# Patient Record
Sex: Male | Born: 1980 | Race: White | Hispanic: No | Marital: Married | Smoking: Never smoker
Health system: Southern US, Community
[De-identification: ages and names within clinical notes are randomized; demographics above are authoritative.]

## PROBLEM LIST (undated history)

## (undated) DIAGNOSIS — J309 Allergic rhinitis, unspecified: Secondary | ICD-10-CM

## (undated) DIAGNOSIS — K219 Gastro-esophageal reflux disease without esophagitis: Secondary | ICD-10-CM

## (undated) HISTORY — DX: Allergic rhinitis, unspecified: J30.9

## (undated) HISTORY — DX: Gastro-esophageal reflux disease without esophagitis: K21.9

---

## 2016-11-23 ENCOUNTER — Encounter: Payer: Self-pay | Admitting: Internal Medicine

## 2016-11-23 ENCOUNTER — Ambulatory Visit (INDEPENDENT_AMBULATORY_CARE_PROVIDER_SITE_OTHER): Payer: Self-pay | Admitting: Internal Medicine

## 2016-11-23 DIAGNOSIS — F5101 Primary insomnia: Secondary | ICD-10-CM

## 2016-11-23 DIAGNOSIS — E669 Obesity, unspecified: Secondary | ICD-10-CM | POA: Insufficient documentation

## 2016-11-23 DIAGNOSIS — G4700 Insomnia, unspecified: Secondary | ICD-10-CM | POA: Insufficient documentation

## 2016-11-23 DIAGNOSIS — J309 Allergic rhinitis, unspecified: Secondary | ICD-10-CM | POA: Insufficient documentation

## 2016-11-23 MED ORDER — ZOLPIDEM TARTRATE 10 MG PO TABS: 10.0000 mg | ORAL_TABLET | Freq: Every evening | ORAL | 1 refills | Status: DC | PRN

## 2016-11-23 NOTE — Progress Notes (Signed)
   Subjective:    Patient ID: Eric Gonzales, male    DOB: 10-30-1980, 36 y.o.   MRN: [id number]  HPI  Here as new pt -  Works as Astronomer;  Often flies to [employer] for work but can only work at night as would be too disruptive to normal operations during the day.  Flies at [place] twice per month, usually less than 1 wk each time, hard to get to sleep in the AM when working, and hard to then get back to regular sleep hours on return home. Tylenol PM use makes him drowsy. Did try moms ambien and slept 8 hrs.  Mother is Eric Gonzales and Eric Gonzales, both patients here as well.  No recent wt gain for several yrs, does not snore that he know of, tends to be a light sleeper.  No reports of sleep difficulty otherwise or daytime somnolence.  Wt Readings from Last 3 Encounters:  11/23/16 240 lb (108.9 kg)    Past Medical History:  Diagnosis Date  . Allergic rhinitis    History reviewed. No pertinent surgical history.  reports that he has never smoked. He has never used smokeless tobacco. He reports that he does not drink alcohol or use drugs. family history includes Alcohol abuse in his maternal grandfather; Diabetes in his father and mother; Heart disease in his father and mother; Hyperlipidemia in his father and mother. No Known Allergies No current outpatient prescriptions on file prior to visit.   No current facility-administered medications on file prior to visit.    Review of Systems All otherwise neg per pt except does have some ongoing anxiety and frend died in car accident wearing seatbelt    Objective:   Physical Exam BP 120/84   Pulse 77   Ht  (1.778 m)   Wt 240 lb (108.9 kg)   SpO2 98%   BMI 34.44 kg/m   VS noted, obese Constitutional: Pt appears in NAD HENT: Head: NCAT.  Right Ear: External ear normal.  Left Ear: External ear normal.  Eyes: . Pupils are equal, round, and reactive to light. Conjunctivae and EOM are normal Nose: without d/c or deformity Neck:  Neck supple. Gross normal ROM Cardiovascular: Normal rate and regular rhythm.   Pulmonary/Chest: Effort normal and breath sounds without rales or wheezing.  Abd:  Soft, NT, ND, + BS, no organomegaly Neurological: Pt is alert. At baseline orientation, motor grossly intact Skin: Skin is warm. No rashes, other new lesions, no LE edema Psychiatric: Pt behavior is normal without agitation , mild nervous No other exam findings     Assessment & Plan:

## 2016-11-23 NOTE — Progress Notes (Signed)
Pre visit review using our clinic review tool, if applicable. No additional management support is needed unless otherwise documented below in the visit note. 

## 2016-11-23 NOTE — Assessment & Plan Note (Signed)
With situational sleep difficutly not excepted to improve due to work requirement, for [employer] 10 qhs prn,  to f/u any worsening symptoms or concerns

## 2016-11-23 NOTE — Patient Instructions (Addendum)
Please take all new medication as prescribed - the ambien  Please continue all other medications as before, and refills have been done if requested.  Please have the pharmacy call with any other refills you may need.  Please continue your efforts at being more active, low cholesterol diet, and weight control.  Please keep your appointments with your specialists as you may have planned  Please return in 1 year for your yearly visit, or sooner if needed, or as needed

## 2018-01-03 ENCOUNTER — Encounter

## 2018-01-03 ENCOUNTER — Ambulatory Visit (INDEPENDENT_AMBULATORY_CARE_PROVIDER_SITE_OTHER): Payer: Self-pay | Admitting: Internal Medicine

## 2018-01-03 ENCOUNTER — Encounter: Payer: Self-pay | Admitting: Internal Medicine

## 2018-01-03 VITALS — BP 120/86 | HR 81 | Ht 70.0 in | Wt 247.0 lb

## 2018-01-03 DIAGNOSIS — E66811 Obesity, class 1: Secondary | ICD-10-CM

## 2018-01-03 DIAGNOSIS — K219 Gastro-esophageal reflux disease without esophagitis: Secondary | ICD-10-CM

## 2018-01-03 DIAGNOSIS — F5101 Primary insomnia: Secondary | ICD-10-CM

## 2018-01-03 DIAGNOSIS — E669 Obesity, unspecified: Secondary | ICD-10-CM

## 2018-01-03 HISTORY — DX: Gastro-esophageal reflux disease without esophagitis: K21.9

## 2018-01-03 MED ORDER — PANTOPRAZOLE SODIUM 40 MG PO TBEC: 40.0000 mg | DELAYED_RELEASE_TABLET | Freq: Every day | ORAL | 3 refills | Status: DC

## 2018-01-03 MED ORDER — ZOLPIDEM TARTRATE 10 MG PO TABS: 10.0000 mg | ORAL_TABLET | Freq: Every evening | ORAL | 1 refills | Status: DC | PRN

## 2018-01-03 NOTE — Patient Instructions (Signed)
Please take all new medication as prescribed - the protonix  Please continue all other medications as before, and refills have been done if requested - ambien  Please have the pharmacy call with any other refills you may need.  Please continue your efforts at being more active, low cholesterol diet, and weight control.  Please keep your appointments with your specialists as you may have planned

## 2018-01-03 NOTE — Progress Notes (Signed)
   Subjective:    Patient ID: Eric Gonzales, male    DOB: 1981-03-07, 37 y.o.   MRN: [id number]  HPI  Here to f/u; overall doing ok,  Pt denies chest pain, increasing sob or doe, wheezing, orthopnea, PND, increased LE swelling, palpitations, dizziness or syncope.  Pt denies new neurological symptoms such as new headache, or facial or extremity weakness or numbness.  Pt denies polydipsia, polyuria, or low sugar episode.  Pt states overall good compliance with meds, mostly trying to follow appropriate diet, with wt overall stable,  but little exercise however. Has ongoing insomnia recently in pat few months, asks for ambien refill.  Denies worsening depressive symptoms, suicidal ideation, or panic.  Has gained several more pounds, denies daytime hypersomnolence . Has had mild worsening reflux, but no abd pain, dysphagia, n/v, bowel change or blood. Wt Readings from Last 3 Encounters:  01/03/18 247 lb (112 kg)  11/23/16 240 lb (108.9 kg)   Past Medical History:  Diagnosis Date  . Allergic rhinitis   . GERD (gastroesophageal reflux disease) 01/03/2018   History reviewed. No pertinent surgical history.  reports that he has never smoked. He has never used smokeless tobacco. He reports that he does not drink alcohol or use drugs. family history includes Alcohol abuse in his maternal grandfather; Diabetes in his father and mother; Heart disease in his father and mother; Hyperlipidemia in his father and mother. No Known Allergies  Review of Systems All otherwise neg per pt     Objective:   Physical Exam BP 120/86 (BP Location: Left Arm, Patient Position: Sitting, Cuff Size: Large)   Pulse 81   Ht 5\' 10"  (1.778 m)   Wt 247 lb (112 kg)   SpO2 98%   BMI 35.44 kg/m  VS noted, obese Constitutional: Pt appears in NAD HENT: Head: NCAT.  Right Ear: External ear normal.  Left Ear: External ear normal.  Eyes: . Pupils are equal, round, and reactive to light. Conjunctivae and EOM are normal Nose:  without d/c or deformity Neck: Neck supple. Gross normal ROM Cardiovascular: Normal rate and regular rhythm.   Pulmonary/Chest: Effort normal and breath sounds without rales or wheezing.  Abd:  Soft, NT, ND, + BS, no organomegaly Neurological: Pt is alert. At baseline orientation, motor grossly intact Skin: Skin is warm. No rashes, other new lesions, no LE edema Psychiatric: Pt behavior is normal without agitation , 1+ nervous No prior labs, declines lab today    Assessment & Plan:

## 2018-01-05 NOTE — Assessment & Plan Note (Signed)
D/w pt, for increased activity, lower calories, wt control

## 2018-01-05 NOTE — Assessment & Plan Note (Signed)
Mild to mod, for PPI asd,  to f/u any worsening symptoms or concerns 

## 2018-01-05 NOTE — Assessment & Plan Note (Signed)
Chronic persistent, for ambien refill

## 2018-01-08 ENCOUNTER — Telehealth: Payer: Self-pay | Admitting: Internal Medicine

## 2018-01-08 MED ORDER — ZOLPIDEM TARTRATE 10 MG PO TABS: 10.0000 mg | ORAL_TABLET | Freq: Every evening | ORAL | 1 refills | Status: DC | PRN

## 2018-01-08 MED ORDER — PANTOPRAZOLE SODIUM 40 MG PO TBEC: 40.0000 mg | DELAYED_RELEASE_TABLET | Freq: Every day | ORAL | 3 refills | Status: DC

## 2018-01-08 NOTE — Telephone Encounter (Signed)
Copied from CRM [phone]. Topic: Quick Communication - See Telephone Encounter >> Jan 08, 2018  1:57 PM Tamela OddiMartin, Don'Quashia, NT wrote: CRM for notification. See Telephone encounter for: 01/08/18.  Patient called and states his medication pantoprazole (PROTONIX) 40 MG tablet and zolpidem (AMBIEN) 10 MG tablet was [facility] to [facility]. [facility] [address] - [place], [place] - [address] [phone] (Phone) [phone] (Fax)

## 2018-01-08 NOTE — Telephone Encounter (Signed)
Done erx 

## 2018-10-20 ENCOUNTER — Other Ambulatory Visit: Payer: Self-pay | Admitting: Internal Medicine

## 2018-10-20 NOTE — Telephone Encounter (Signed)
Done erx  Needs OV for further refills after this

## 2019-02-23 ENCOUNTER — Other Ambulatory Visit: Payer: Self-pay | Admitting: Internal Medicine

## 2019-03-04 ENCOUNTER — Other Ambulatory Visit: Payer: Self-pay | Admitting: Internal Medicine

## 2019-03-04 NOTE — Telephone Encounter (Signed)
Done 30 days only  Please let pt know he is due for yearly OV f/u

## 2019-04-03 ENCOUNTER — Ambulatory Visit (INDEPENDENT_AMBULATORY_CARE_PROVIDER_SITE_OTHER): Payer: Self-pay | Admitting: Internal Medicine

## 2019-04-03 DIAGNOSIS — J309 Allergic rhinitis, unspecified: Secondary | ICD-10-CM

## 2019-04-03 DIAGNOSIS — F5101 Primary insomnia: Secondary | ICD-10-CM

## 2019-04-03 DIAGNOSIS — K219 Gastro-esophageal reflux disease without esophagitis: Secondary | ICD-10-CM

## 2019-04-03 MED ORDER — ZOLPIDEM TARTRATE 10 MG PO TABS: 10.0000 mg | ORAL_TABLET | Freq: Every evening | ORAL | 1 refills | Status: DC | PRN

## 2019-04-03 MED ORDER — PANTOPRAZOLE SODIUM 40 MG PO TBEC: 40.0000 mg | DELAYED_RELEASE_TABLET | Freq: Every day | ORAL | 3 refills | Status: DC

## 2019-04-03 NOTE — Progress Notes (Signed)
Patient ID: Eric Gonzales, male   DOB: 03/24/1981, 38 y.o.   MRN: [id number]  Virtual Visit via Video Note  I connected with Eric Gonzales on 04/03/19 at  2:40 PM EDT by a video enabled telemedicine application and verified that I am speaking with the correct person using two identifiers.  Location: Patient: at home Provider: at office   I discussed the limitations of evaluation and management by telemedicine and the availability of in person appointments. The patient expressed understanding and agreed to proceed.  History of Present Illness: Here to f/u; overall doing ok,  Pt denies chest pain, increasing sob or doe, wheezing, orthopnea, PND, increased LE swelling, palpitations, dizziness or syncope.  Pt denies new neurological symptoms such as new headache, or facial or extremity weakness or numbness.  Pt denies polydipsia, polyuria, or low sugar episode.  Pt states overall good compliance with meds, though has now run out.  Denies worsening reflux, abd pain, dysphagia, n/v, bowel change or blood, except for reflux worsening in the past week out of me.  Asks for sleep med as has worsening sleep onset most nights but can usually stay asleep after.  Does have several wks ongoing nasal allergy symptoms with clearish congestion, itch and sneezing, without fever, pain, ST, cough, swelling or wheezing. Past Medical History:  Diagnosis Date  . Allergic rhinitis   . GERD (gastroesophageal reflux disease) 01/03/2018   No past surgical history on file.  reports that he has never smoked. He has never used smokeless tobacco. He reports that he does not drink alcohol or use drugs. family history includes Alcohol abuse in his maternal grandfather; Diabetes in his father and mother; Heart disease in his father and mother; Hyperlipidemia in his father and mother. No Known Allergies No current outpatient medications on file prior to visit.   No current facility-administered medications on file prior to  visit.     Observations/Objective: Alert, NAD, appropriate mood and affect, resps normal, cn 2-12 intact, moves all 4s, no visible rash or swelling No results found for: WBC, HGB, HCT, PLT, GLUCOSE, CHOL, TRIG, HDL, LDLDIRECT, LDLCALC, ALT, AST, NA, K, CL, CREATININE, BUN, CO2, TSH, PSA, INR, GLUF, HGBA1C, MICROALBUR Declines furhter labs, does not have health insurance at this time  Assessment and Plan: See notes  Follow Up Instructions: See notes   I discussed the assessment and treatment plan with the patient. The patient was provided an opportunity to ask questions and all were answered. The patient agreed with the plan and demonstrated an understanding of the instructions.   The patient was advised to call back or seek an in-person evaluation if the symptoms worsen or if the condition fails to improve as anticipated.  Cathlean Cower, MD

## 2019-04-04 ENCOUNTER — Encounter: Payer: Self-pay | Admitting: Internal Medicine

## 2019-04-04 NOTE — Patient Instructions (Signed)
Please continue all other medications as before, and refills have been done if requested.  Please have the pharmacy call with any other refills you may need.  Please continue your efforts at being more active, low cholesterol diet, and weight control.  Please keep your appointments with your specialists as you may have planned     

## 2019-04-04 NOTE — Assessment & Plan Note (Signed)
For ambien qhs prn,  to f/u any worsening symptoms or concerns  

## 2019-04-04 NOTE — Assessment & Plan Note (Signed)
For PPI,  to f/u any worsening symptoms or concerns  

## 2019-04-04 NOTE — Assessment & Plan Note (Signed)
For otc nasacort and claritin prn

## 2019-11-11 ENCOUNTER — Other Ambulatory Visit: Payer: Self-pay | Admitting: Internal Medicine

## 2019-11-11 NOTE — Telephone Encounter (Signed)
Done erx 

## 2020-08-01 ENCOUNTER — Ambulatory Visit: Payer: Self-pay | Admitting: Internal Medicine

## 2020-08-01 DIAGNOSIS — Z0289 Encounter for other administrative examinations: Secondary | ICD-10-CM

## 2020-08-04 ENCOUNTER — Encounter: Payer: Self-pay | Admitting: Internal Medicine

## 2020-08-04 ENCOUNTER — Other Ambulatory Visit: Payer: Self-pay

## 2020-08-04 ENCOUNTER — Telehealth (INDEPENDENT_AMBULATORY_CARE_PROVIDER_SITE_OTHER): Payer: Self-pay | Admitting: Internal Medicine

## 2020-08-04 DIAGNOSIS — J309 Allergic rhinitis, unspecified: Secondary | ICD-10-CM

## 2020-08-04 DIAGNOSIS — F5101 Primary insomnia: Secondary | ICD-10-CM

## 2020-08-04 DIAGNOSIS — K219 Gastro-esophageal reflux disease without esophagitis: Secondary | ICD-10-CM

## 2020-08-04 DIAGNOSIS — M25512 Pain in left shoulder: Secondary | ICD-10-CM | POA: Insufficient documentation

## 2020-08-04 MED ORDER — PANTOPRAZOLE SODIUM 40 MG PO TBEC: 40.0000 mg | DELAYED_RELEASE_TABLET | Freq: Every day | ORAL | 3 refills | Status: DC

## 2020-08-04 MED ORDER — ZOLPIDEM TARTRATE 10 MG PO TABS: 10.0000 mg | ORAL_TABLET | Freq: Every evening | ORAL | 1 refills | Status: DC | PRN

## 2020-08-04 NOTE — Assessment & Plan Note (Signed)
See notes

## 2020-08-04 NOTE — Patient Instructions (Signed)
Please take all new medication as prescribed  Please see sports medicine for the left shoulder

## 2020-08-04 NOTE — Progress Notes (Signed)
Patient ID: UZZIAH RIGG, male   DOB: 09-05-1980, 40 y.o.   MRN: [id number]  Cumulative time during 7-day interval 24 min, there was not an associated office visit for this concern within a 7 day period.  Verbal consent for services obtained from patient prior to services given.  Names of all persons present for services: Oliver Barre, MD, patient  Chief complaint: f/u reflux, insomnia, allergies with left eustachian dysfxn, and left shoulder pain  History, background, results pertinent:  40 yo M here with c/o persistent primary insomnia most nights, usually well controlled wth 1/2 - 1 of the 10 mg ambien without significant side effects and seems to be effective, asks for refill.  Also has ongoing reflux despite anti-reflux diet worse even in 1 day if he missed his PPI, asks for refill.  Also c/o left ear fullness and eustachian type symptoms when lying on left side at night and wkaing up in the AM, overall mild, but wondering if can be avoided as it is worse this time of year for last 2-3 yrs.   Also mentions left shoulder pain only in a certain position associated with abduction raising the left hand, but severe, reproducible ongoing for > 3 mo after his hand slipped up the trunk of a tree he was pushing very hard.   Past Medical History:  Diagnosis Date  . Allergic rhinitis   . GERD (gastroesophageal reflux disease) 01/03/2018   No results found for this or any previous visit (from the past 48 hour(s)).  No current outpatient medications on file prior to visit.   No current facility-administered medications on file prior to visit.   A/P/next steps:   Insomnia - cont ambien qhs prn  GERD - for continued PPI  Allergies with left eustachian dysfxn - for allegra, nasacort, mucinex prn  Left shoulder pain - suspicous for partial left rot cuff tearing - urged to f/u with sport medicine   Oliver Barre MD

## 2021-01-26 ENCOUNTER — Telehealth: Payer: Self-pay | Admitting: Internal Medicine

## 2021-01-26 NOTE — Telephone Encounter (Signed)
   Patient called and said that he tested positive for covid 19 today and has a headache and back pain. He is wanting to know what he can take OTC. Patient declined virtual/ phone visit\. Please advise

## 2021-01-27 ENCOUNTER — Other Ambulatory Visit: Payer: Self-pay | Admitting: Internal Medicine

## 2021-01-27 MED ORDER — NIRMATRELVIR/RITONAVIR (PAXLOVID)TABLET: 3.0000 | ORAL_TABLET | Freq: Two times a day (BID) | ORAL | 0 refills | Status: AC

## 2021-01-27 NOTE — Telephone Encounter (Signed)
See orders already addressed

## 2021-02-01 ENCOUNTER — Other Ambulatory Visit: Payer: Self-pay | Admitting: Internal Medicine

## 2021-02-01 MED ORDER — GUAIFENESIN ER 600 MG PO TB12: 1200.0000 mg | ORAL_TABLET | Freq: Two times a day (BID) | ORAL | 1 refills | Status: DC | PRN

## 2021-02-08 ENCOUNTER — Emergency Department (HOSPITAL_BASED_OUTPATIENT_CLINIC_OR_DEPARTMENT_OTHER): Payer: Self-pay

## 2021-02-08 ENCOUNTER — Emergency Department (HOSPITAL_BASED_OUTPATIENT_CLINIC_OR_DEPARTMENT_OTHER)
Admission: EM | Admit: 2021-02-08 | Discharge: 2021-02-08 | Disposition: A | Discharge status: Home / Self Care | Payer: Self-pay | Attending: Emergency Medicine | Admitting: Emergency Medicine

## 2021-02-08 ENCOUNTER — Encounter (HOSPITAL_BASED_OUTPATIENT_CLINIC_OR_DEPARTMENT_OTHER): Payer: Self-pay | Admitting: Emergency Medicine

## 2021-02-08 ENCOUNTER — Other Ambulatory Visit: Payer: Self-pay

## 2021-02-08 DIAGNOSIS — N201 Calculus of ureter: Secondary | ICD-10-CM

## 2021-02-08 DIAGNOSIS — N132 Hydronephrosis with renal and ureteral calculous obstruction: Secondary | ICD-10-CM | POA: Insufficient documentation

## 2021-02-08 DIAGNOSIS — K219 Gastro-esophageal reflux disease without esophagitis: Secondary | ICD-10-CM | POA: Insufficient documentation

## 2021-02-08 LAB — URINALYSIS, ROUTINE W REFLEX MICROSCOPIC
Bilirubin Urine: NEGATIVE
Glucose, UA: NEGATIVE mg/dL
Ketones, ur: NEGATIVE mg/dL
Leukocytes,Ua: NEGATIVE
Nitrite: NEGATIVE
Protein, ur: NEGATIVE mg/dL
Specific Gravity, Urine: >1.03 — ABNORMAL HIGH (ref 1.005–1.030)
pH: 5 (ref 5.0–8.0)

## 2021-02-08 LAB — URINALYSIS, MICROSCOPIC (REFLEX)

## 2021-02-08 MED ORDER — ONDANSETRON 8 MG PO TBDP: 8.0000 mg | ORAL_TABLET | Freq: Three times a day (TID) | ORAL | 0 refills | Status: DC | PRN

## 2021-02-08 MED ORDER — FENTANYL CITRATE (PF) 100 MCG/2ML IJ SOLN
50.0000 ug | INTRAMUSCULAR | Status: AC | PRN
  Administered 2021-02-08 (×2): 50 ug via INTRAVENOUS
  Filled 2021-02-08 (×2): qty 2

## 2021-02-08 MED ORDER — HYDROMORPHONE HCL 2 MG PO TABS: 2.0000 mg | ORAL_TABLET | ORAL | 0 refills | Status: DC | PRN

## 2021-02-08 MED ORDER — ONDANSETRON HCL 4 MG/2ML IJ SOLN
4.0000 mg | Freq: Once | INTRAMUSCULAR | Status: AC
  Administered 2021-02-08: 4 mg via INTRAVENOUS
  Filled 2021-02-08: qty 2

## 2021-02-08 MED ORDER — NAPROXEN 500 MG PO TABS: ORAL_TABLET | ORAL | 0 refills | Status: DC

## 2021-02-08 MED ORDER — SODIUM CHLORIDE 0.9 % IV BOLUS
1000.0000 mL | Freq: Once | INTRAVENOUS | Status: AC
  Administered 2021-02-08: 1000 mL via INTRAVENOUS

## 2021-02-08 MED ORDER — KETOROLAC TROMETHAMINE 15 MG/ML IJ SOLN
15.0000 mg | Freq: Once | INTRAMUSCULAR | Status: AC
  Administered 2021-02-08: 15 mg via INTRAVENOUS
  Filled 2021-02-08: qty 1

## 2021-02-08 NOTE — ED Notes (Signed)
Pt has been transported to CT.

## 2021-02-08 NOTE — ED Triage Notes (Signed)
Pt c/o right flank pain x 2 hours.  

## 2021-02-08 NOTE — ED Provider Notes (Signed)
[facility] Provider Note: Eric Dell, MD, FACEP  CSN: [id number] MRN: [id number] ARRIVAL: 02/08/21 at 0156 ROOM: [facility]   CHIEF COMPLAINT  Flank Pain   HISTORY OF PRESENT ILLNESS  02/08/21 2:23 AM Eric Gonzales is a 40 y.o. male with right flank pain that began about 2 hours prior to arrival.  He rates the pain as a 10 out of 10.  It is not a pain he has ever had before.  It is not better or worse with movement.  It has not been associated with nausea or vomiting.  He has not noticed hematuria but has had difficulty voiding.   Past Medical History:  Diagnosis Date   Allergic rhinitis    GERD (gastroesophageal reflux disease) 01/03/2018    History reviewed. No pertinent surgical history.  Family History  Problem Relation Age of Onset   Heart disease Mother    Hyperlipidemia Mother    Diabetes Mother    Heart disease Father    Hyperlipidemia Father    Diabetes Father    Alcohol abuse Maternal Grandfather     Social History   Tobacco Use   Smoking status: Never   Smokeless tobacco: Never  Substance Use Topics   Alcohol use: No   Drug use: No    Prior to Admission medications   Medication Sig Start Date End Date Taking? Authorizing Provider  HYDROmorphone (DILAUDID) 2 MG tablet Take 1 tablet (2 mg total) by mouth every 4 (four) hours as needed for severe pain. 02/08/21  Yes Luane Rochon, MD  naproxen (NAPROSYN) 500 MG tablet Take 1 tablet twice daily until stone passes. 02/08/21  Yes Karson Reede, MD  ondansetron (ZOFRAN ODT) 8 MG disintegrating tablet Take 1 tablet (8 mg total) by mouth every 8 (eight) hours as needed. 02/08/21  Yes Jeralynn Vaquera, MD  guaiFENesin (MUCINEX) 600 MG 12 hr tablet Take 2 tablets (1,200 mg total) by mouth 2 (two) times daily as needed. 02/01/21   Corwin Levins, MD  pantoprazole (PROTONIX) 40 MG tablet Take 1 tablet (40 mg total) by mouth daily. 08/04/20   Corwin Levins, MD  zolpidem (AMBIEN) 10 MG tablet Take 1 tablet (10 mg  total) by mouth at bedtime as needed. for sleep 08/04/20   Corwin Levins, MD    Allergies Patient has no known allergies.   REVIEW OF SYSTEMS  Negative except as noted here or in the History of Present Illness.   PHYSICAL EXAMINATION  Initial Vital Signs Blood pressure (!) 157/96, pulse 80, temperature 99.2 F (37.3 C), temperature source Oral, resp. rate 20, height 5\' 10"  (1.778 m), weight 108.9 kg, SpO2 100 %.  Examination General: Well-developed, well-nourished male in no acute distress; appearance consistent with age of record HENT: normocephalic; atraumatic Eyes: pupils equal, round and reactive to light; extraocular muscles intact Neck: supple Heart: regular rate and rhythm Lungs: clear to auscultation bilaterally Abdomen: soft; nondistended; nontender; bowel sounds present GU: No CVA tenderness Extremities: No deformity; full range of motion; pulses normal Neurologic: Awake, alert and oriented; motor function intact in all extremities and symmetric; no facial droop Skin: Warm and dry Psychiatric: Normal mood and affect   RESULTS  Summary of this visit's results, reviewed and interpreted by myself:   EKG Interpretation  Date/Time:    Ventricular Rate:    PR Interval:    QRS Duration:   QT Interval:    QTC Calculation:   R Axis:     Text Interpretation:  Laboratory Studies: Results for orders placed or performed during the hospital encounter of 02/08/21 (from the past 24 hour(s))  Urinalysis, Routine w reflex microscopic Urine, Clean Catch     Status: Abnormal   Collection Time: 02/08/21  2:14 AM  Result Value Ref Range   Color, Urine YELLOW YELLOW   APPearance CLEAR CLEAR   Specific Gravity, Urine >1.030 (H) 1.005 - 1.030   pH 5.0 5.0 - 8.0   Glucose, UA NEGATIVE NEGATIVE mg/dL   Hgb urine dipstick MODERATE (A) NEGATIVE   Bilirubin Urine NEGATIVE NEGATIVE   Ketones, ur NEGATIVE NEGATIVE mg/dL   Protein, ur NEGATIVE NEGATIVE mg/dL   Nitrite  NEGATIVE NEGATIVE   Leukocytes,Ua NEGATIVE NEGATIVE  Urinalysis, Microscopic (reflex)     Status: Abnormal   Collection Time: 02/08/21  2:14 AM  Result Value Ref Range   RBC / HPF 6-10 0 - 5 RBC/hpf   WBC, UA 0-5 0 - 5 WBC/hpf   Bacteria, UA RARE (A) NONE SEEN   Squamous Epithelial / LPF 0-5 0 - 5   Mucus PRESENT    Imaging Studies: CT Renal Stone Study  Result Date: 02/08/2021 CLINICAL DATA:  Right flank pain EXAM: CT ABDOMEN AND PELVIS WITHOUT CONTRAST TECHNIQUE: Multidetector CT imaging of the abdomen and pelvis was performed following the standard protocol without IV contrast. COMPARISON:  None. FINDINGS: LOWER CHEST: Normal. HEPATOBILIARY: Normal hepatic contours. No intra- or extrahepatic biliary dilatation. The gallbladder is normal. PANCREAS: Normal pancreas. No ductal dilatation or peripancreatic fluid collection. SPLEEN: Normal. ADRENALS/URINARY TRACT: The adrenal glands are normal. There is a stone within the distal ureter measuring 3-4 mm, causing mild hydroureteronephrosis and mild periureteral stranding. The urinary bladder is normal for degree of distention STOMACH/BOWEL: There is no hiatal hernia. Normal duodenal course and caliber. No small bowel dilatation or inflammation. No focal colonic abnormality. Normal appendix. VASCULAR/LYMPHATIC: Normal course and caliber of the major abdominal vessels. No abdominal or pelvic lymphadenopathy. REPRODUCTIVE: Normal prostate size with symmetric seminal vesicles. MUSCULOSKELETAL. No bony spinal canal stenosis or focal osseous abnormality. OTHER: None. IMPRESSION: Right-sided obstructive uropathy with 3-4 mm stone in the distal right ureter causing mild hydroureteronephrosis and mild periureteral stranding. Electronically Signed   By: Deatra Robinson M.D.   On: 02/08/2021 03:16    ED COURSE and MDM  Nursing notes, initial and subsequent vitals signs, including pulse oximetry, reviewed and interpreted by myself.  Vitals:   02/08/21 0210  02/08/21 0348  BP: (!) 157/96 (!) 162/93  Pulse: 80 87  Resp: 20   Temp: 99.2 F (37.3 C)   TempSrc: Oral   SpO2: 100% 97%  Weight: 108.9 kg   Height: 5\' 10"  (1.778 m)    Medications  fentaNYL (SUBLIMAZE) injection 50 mcg (50 mcg Intravenous Given 02/08/21 0328)  ondansetron (ZOFRAN) injection 4 mg (4 mg Intravenous Given 02/08/21 0226)  sodium chloride 0.9 % bolus 1,000 mL (0 mLs Intravenous Stopped 02/08/21 0348)  ketorolac (TORADOL) 15 MG/ML injection 15 mg (15 mg Intravenous Given 02/08/21 0330)   4:55 AM Pain well controlled at this time.  No evidence of UTI.   PROCEDURES  Procedures   ED DIAGNOSES     ICD-10-CM   1. Ureterolithiasis  N20.1          Tayden Nichelson, MD 02/08/21 [phone]

## 2021-02-12 ENCOUNTER — Other Ambulatory Visit: Payer: Self-pay | Admitting: Internal Medicine

## 2021-02-13 ENCOUNTER — Other Ambulatory Visit: Payer: Self-pay

## 2021-02-13 DIAGNOSIS — K219 Gastro-esophageal reflux disease without esophagitis: Secondary | ICD-10-CM

## 2021-02-13 MED ORDER — PANTOPRAZOLE SODIUM 40 MG PO TBEC: 40.0000 mg | DELAYED_RELEASE_TABLET | Freq: Every day | ORAL | 3 refills | Status: DC

## 2021-09-14 ENCOUNTER — Telehealth: Payer: Self-pay | Admitting: Internal Medicine

## 2021-09-14 MED ORDER — ZOLPIDEM TARTRATE 10 MG PO TABS: 10.0000 mg | ORAL_TABLET | Freq: Every evening | ORAL | 0 refills | Status: DC | PRN

## 2021-09-14 NOTE — Telephone Encounter (Signed)
1.Medication Requested: zolpidem (AMBIEN) 10 MG tablet  2. Pharmacy (Name, Street, [place]): [facility] [address] [place], [place] - [address] AT [facility] [address]  3. On Med List: yes  4. Last Visit with PCP: 08-04-2021  5. Next visit date with PCP: n/a   Agent: Please be advised that RX refills may take up to 3 business days. We ask that you follow-up with your pharmacy.

## 2021-09-14 NOTE — Telephone Encounter (Signed)
Ok to contact pt  1 mo ambien done erx to [facility]  Please to make ROV for further refills

## 2021-09-15 NOTE — Telephone Encounter (Signed)
Called patient to inform patient that his Eric Gonzales rx was sent to pharmacy and that he needs a ROV to get further refills. Patient voicemail is not set up. Unable to leave message

## 2021-10-06 ENCOUNTER — Other Ambulatory Visit: Payer: Self-pay

## 2021-10-06 ENCOUNTER — Ambulatory Visit (INDEPENDENT_AMBULATORY_CARE_PROVIDER_SITE_OTHER): Payer: Self-pay | Admitting: Internal Medicine

## 2021-10-06 ENCOUNTER — Encounter: Payer: Self-pay | Admitting: Internal Medicine

## 2021-10-06 VITALS — BP 116/68 | HR 78 | Temp 98.4°F | Ht 70.0 in | Wt 250.0 lb

## 2021-10-06 DIAGNOSIS — N200 Calculus of kidney: Secondary | ICD-10-CM

## 2021-10-06 DIAGNOSIS — F5101 Primary insomnia: Secondary | ICD-10-CM

## 2021-10-06 DIAGNOSIS — K219 Gastro-esophageal reflux disease without esophagitis: Secondary | ICD-10-CM

## 2021-10-06 MED ORDER — PANTOPRAZOLE SODIUM 40 MG PO TBEC: 40.0000 mg | DELAYED_RELEASE_TABLET | Freq: Every day | ORAL | 3 refills | Status: DC

## 2021-10-06 MED ORDER — ZOLPIDEM TARTRATE 10 MG PO TABS: 10.0000 mg | ORAL_TABLET | Freq: Every evening | ORAL | 1 refills | Status: DC | PRN

## 2021-10-06 NOTE — Progress Notes (Signed)
Patient ID: WEAVER TWEED, male   DOB: Oct 23, 1980, 41 y.o.   MRN: [id number] ? ? ? ?    Chief Complaint: follow up recent renal stone, gerd, insomnia ? ?     HPI:  Eric Gonzales is a 41 y.o. male here to f/u overall doing well, Denies urinary symptoms such as dysuria, frequency, urgency, flank pain, hematuria or n/v, fever, chills.  Denies worsening reflux, abd pain, dysphagia, n/v, bowel change or blood.  Denies worsening depressive symptoms, suicidal ideation, or panic; but has chronic persistent insomnia, asking for ambien refill as this has worked well.   ?      ?Wt Readings from Last 3 Encounters:  ?10/06/21 250 lb (113.4 kg)  ?02/08/21 240 lb (108.9 kg)  ?01/03/18 247 lb (112 kg)  ? ?BP Readings from Last 3 Encounters:  ?10/06/21 116/68  ?02/08/21 (!) 145/79  ?01/03/18 120/86  ? ?      ?Past Medical History:  ?Diagnosis Date  ? Allergic rhinitis   ? GERD (gastroesophageal reflux disease) 01/03/2018  ? ?History reviewed. No pertinent surgical history. ? reports that he has never smoked. He has never used smokeless tobacco. He reports that he does not drink alcohol and does not use drugs. ?family history includes Alcohol abuse in his maternal grandfather; Diabetes in his father and mother; Heart disease in his father and mother; Hyperlipidemia in his father and mother. ?No Known Allergies ?No current outpatient medications on file prior to visit.  ? ?No current facility-administered medications on file prior to visit.  ? ?     ROS:  All others reviewed and negative. ? ?Objective  ? ?     PE:  BP 116/68 (BP Location: Left Arm, Patient Position: Sitting, Cuff Size: Large)   Pulse 78   Temp 98.4 ?F (36.9 ?C) (Oral)   Ht 5\' 10"  (1.778 m)   Wt 250 lb (113.4 kg)   SpO2 96%   BMI 35.87 kg/m?  ? ?              Constitutional: Pt appears in NAD ?              HENT: Head: NCAT.  ?              Right Ear: External ear normal.   ?              Left Ear: External ear normal.  ?              Eyes: . Pupils are equal,  round, and reactive to light. Conjunctivae and EOM are normal ?              Nose: without d/c or deformity ?              Neck: Neck supple. Gross normal ROM ?              Cardiovascular: Normal rate and regular rhythm.   ?              Pulmonary/Chest: Effort normal and breath sounds without rales or wheezing.  ?              Abd:  Soft, NT, ND, + BS, no organomegaly ?              Neurological: Pt is alert. At baseline orientation, motor grossly intact ?              Skin: Skin is warm. No rashes,  no other new lesions, LE edema - none ?              Psychiatric: Pt behavior is normal without agitation  ? ?Micro: none ? ?Cardiac tracings I have personally interpreted today:  none ? ?Pertinent Radiological findings (summarize): none  ? ?No results found for: WBC, HGB, HCT, PLT, GLUCOSE, CHOL, TRIG, HDL, LDLDIRECT, LDLCALC, ALT, AST, NA, K, CL, CREATININE, BUN, CO2, TSH, PSA, INR, GLUF, HGBA1C, MICROALBUR ?Assessment/Plan:  ?Eric Gonzales is a 41 y.o. White or Caucasian [1] male with  has a past medical history of Allergic rhinitis and GERD (gastroesophageal reflux disease) (01/03/2018). ? ?Renal stone ?Asympt, cont to follow, keep hydrated ? ?Insomnia ?Chronic persistent, for ambien refill,  to f/u any worsening symptoms or concerns ? ?GERD (gastroesophageal reflux disease) ?Stable overall, for PPI refill, to f/u any worsening symptoms or concerns ? ?Followup: Return in about 1 year (around 10/07/2022). ? ?Oliver Barre, MD 10/07/2021 4:47 PM ?[facility] ?[facility] ?Internal Medicine ?

## 2021-10-06 NOTE — Patient Instructions (Signed)
Please continue all other medications as before, and refills have been done if requested.  Please have the pharmacy call with any other refills you may need.  Please continue your efforts at being more active, low cholesterol diet, and weight control  Please keep your appointments with your specialists as you may have planned  Please make an Appointment to return for your 1 year visit, or sooner if needed 

## 2021-10-06 NOTE — Progress Notes (Signed)
Patient ID: Eric Gonzales, male   DOB: 02-May-1981, 41 y.o.   MRN: [id number] ? ? ?

## 2021-10-07 ENCOUNTER — Encounter: Payer: Self-pay | Admitting: Internal Medicine

## 2021-10-07 NOTE — Assessment & Plan Note (Signed)
Chronic persistent, for ambien refill,  to f/u any worsening symptoms or concerns  

## 2021-10-07 NOTE — Assessment & Plan Note (Signed)
Stable overall, for PPI refill,  to f/u any worsening symptoms or concerns 

## 2021-10-07 NOTE — Assessment & Plan Note (Signed)
Asympt, cont to follow, keep hydrated ?

## 2022-04-06 ENCOUNTER — Other Ambulatory Visit: Payer: Self-pay | Admitting: Internal Medicine

## 2022-05-30 IMAGING — CT CT RENAL STONE PROTOCOL
2 of 4 series · 17 of 46 positions shown, 19 images · non-contrast
Comparison: None.

CLINICAL DATA: Right flank pain

EXAM:
CT ABDOMEN AND PELVIS WITHOUT CONTRAST
TECHNIQUE: Multidetector CT imaging of the abdomen and pelvis was performed
following the standard protocol without IV contrast.

[Series 2: axial st · axial · 0.98mm/px · z∈[+768,+1218]mm · 14 of 98 slices shown, 16 images]
[im 4/98  soft-tissue]
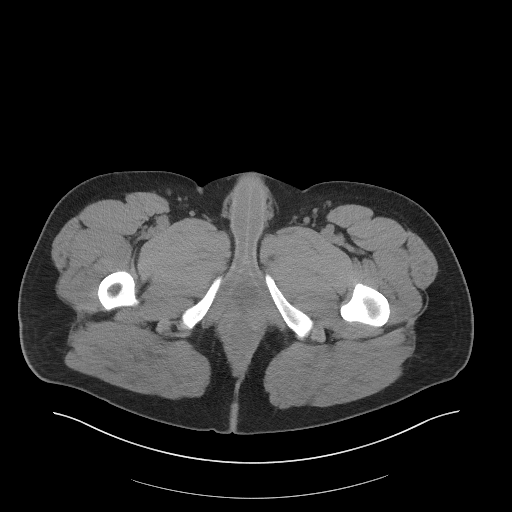
[im 4/98  bone]
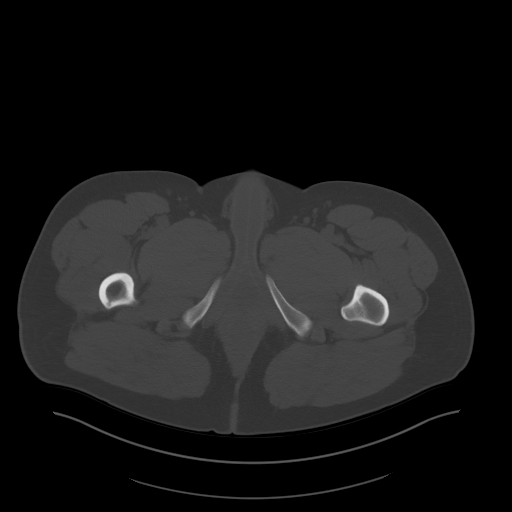
[im 12/98  soft-tissue]
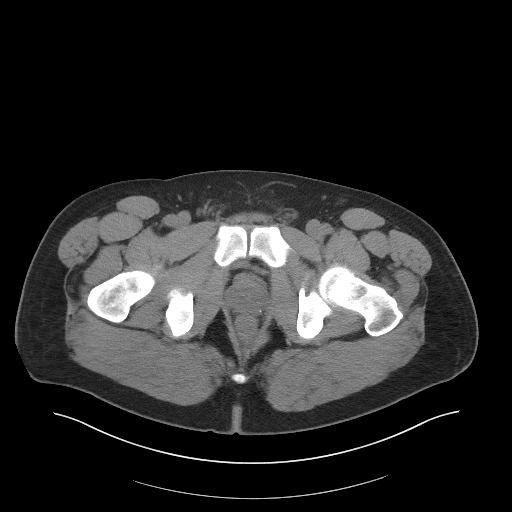
[im 20/98  soft-tissue]
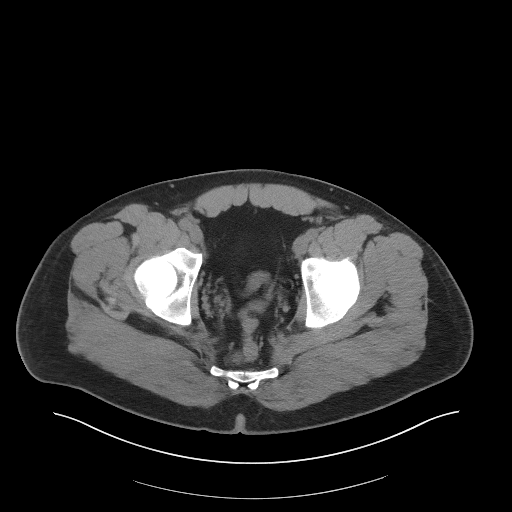
[im 28/98  soft-tissue]
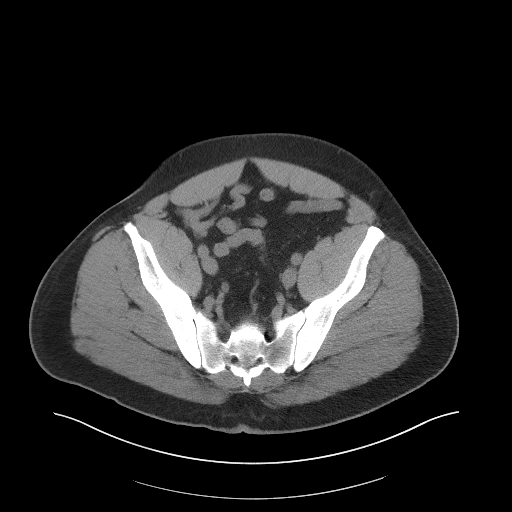
[im 32/98  soft-tissue]
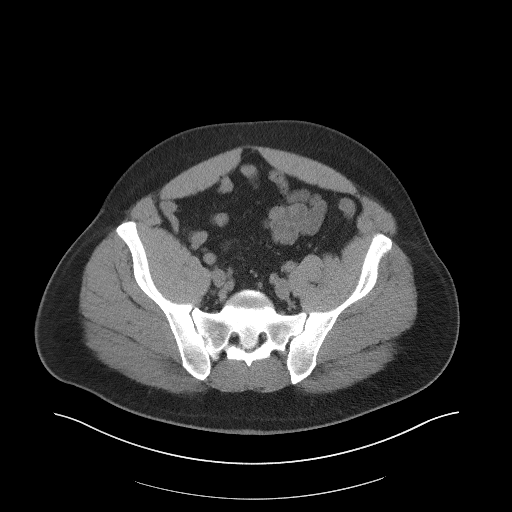
[im 39/98  soft-tissue]
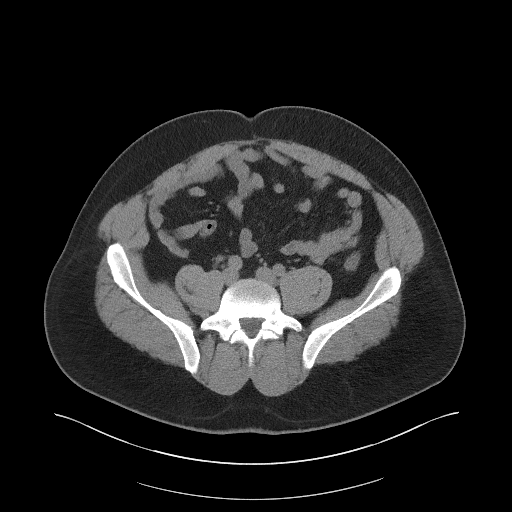
[im 47/98  soft-tissue]
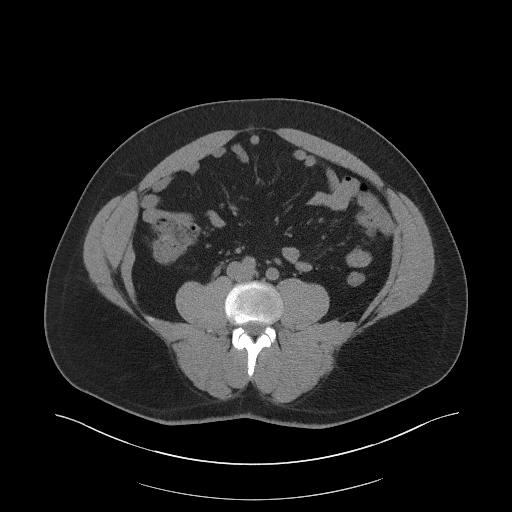
[im 51/98  soft-tissue]
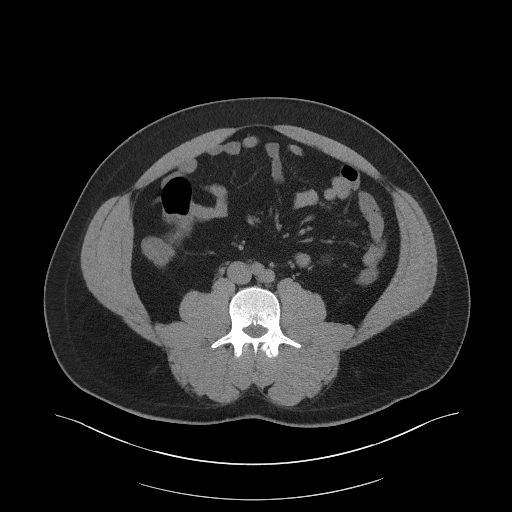
[im 59/98  soft-tissue]
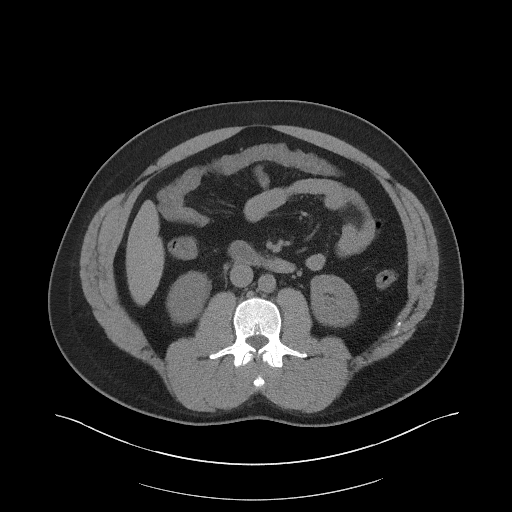
[im 59/98  bone]
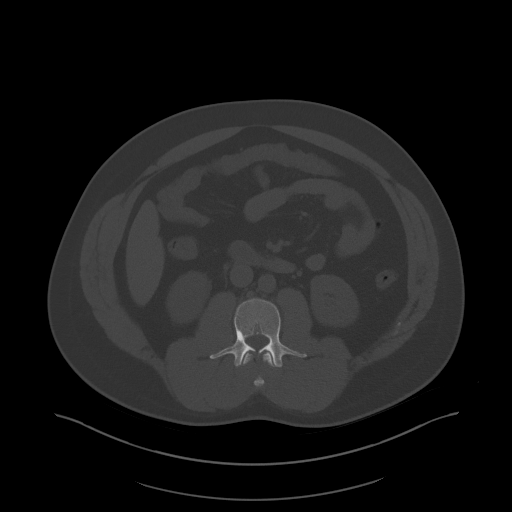
[im 66/98  soft-tissue]
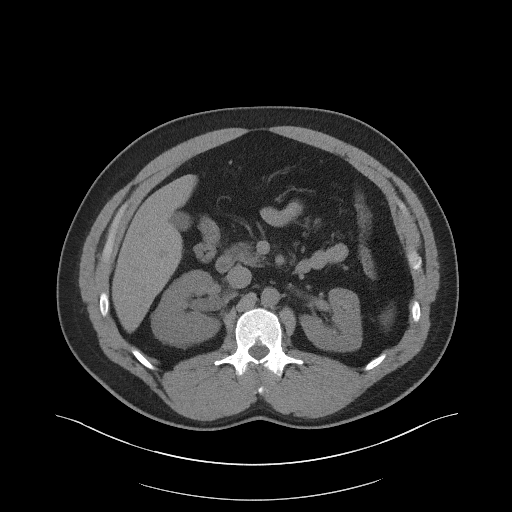
[im 74/98  soft-tissue]
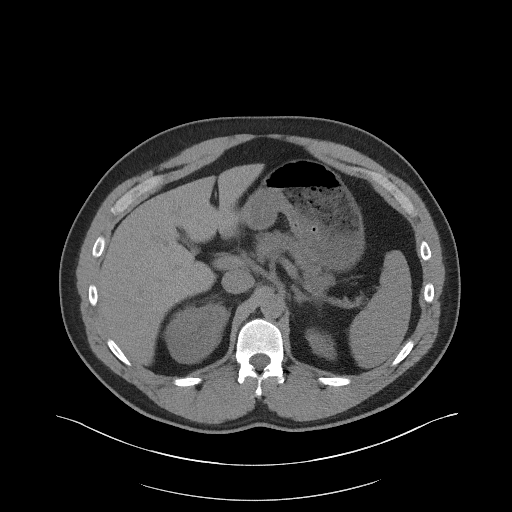
[im 78/98  soft-tissue]
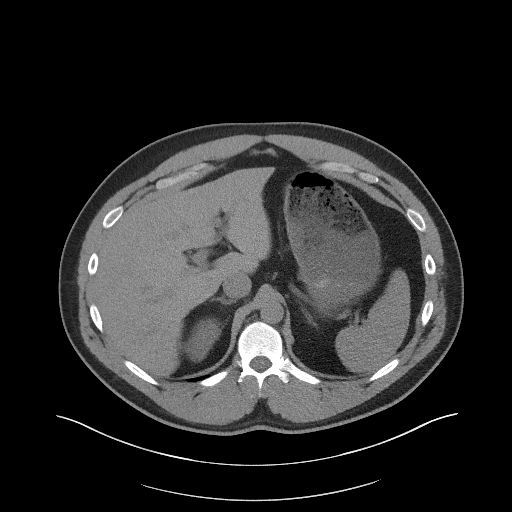
[im 86/98  soft-tissue]
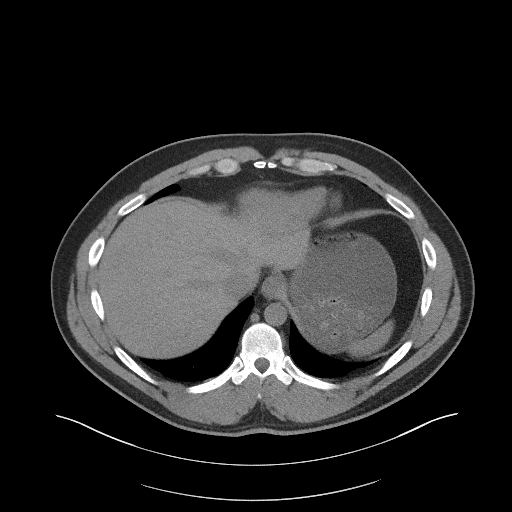
[im 94/98  soft-tissue]
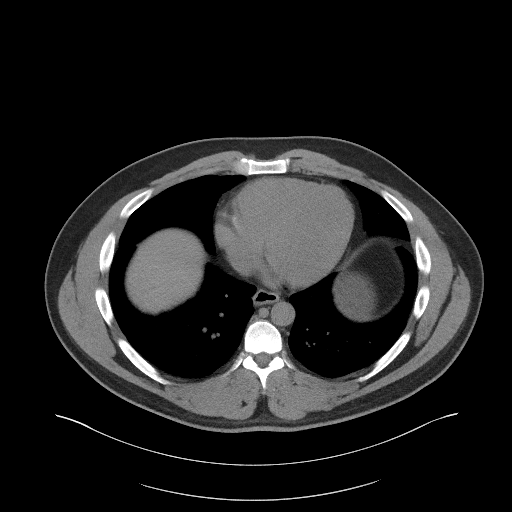

[Series 5: coronal st · coronal · 0.89mm/px · 3 of 101 slices shown]
[im 34/101  soft-tissue]
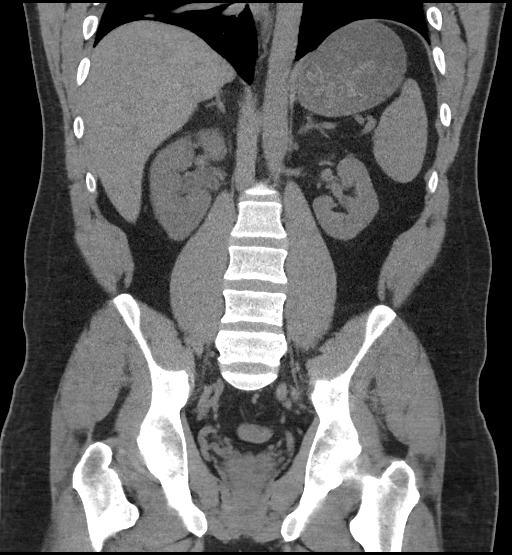
[im 45/101  soft-tissue]
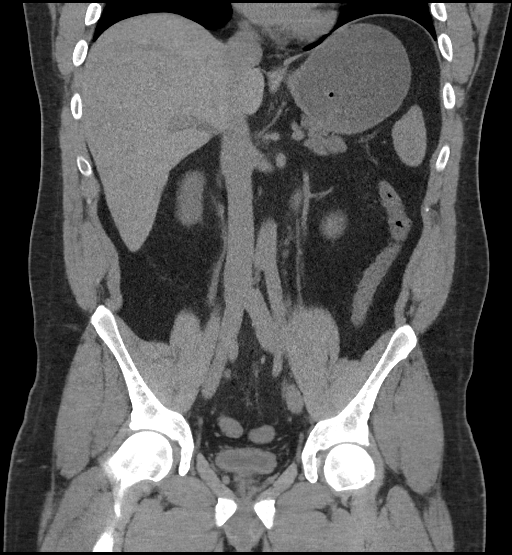
[im 56/101  soft-tissue]
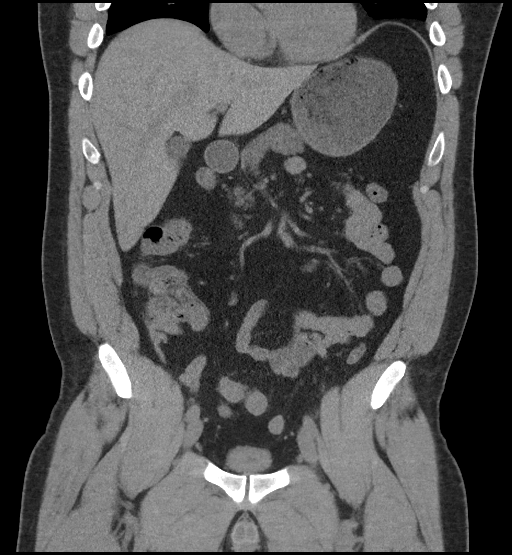

[17 of 46 positions shown; findings below may reference images not displayed]

FINDINGS: LOWER CHEST: Normal.

HEPATOBILIARY: Normal hepatic contours. No intra- or extrahepatic
biliary dilatation. The gallbladder is normal.

PANCREAS: Normal pancreas. No ductal dilatation or peripancreatic
fluid collection.

SPLEEN: Normal.

ADRENALS/URINARY TRACT: The adrenal glands are normal. There is a
stone within the distal ureter measuring 3-4 mm, causing mild
hydroureteronephrosis and mild periureteral stranding. The urinary
bladder is normal for degree of distention

STOMACH/BOWEL: There is no hiatal hernia. Normal duodenal course and
caliber. No small bowel dilatation or inflammation. No focal colonic
abnormality. Normal appendix.

VASCULAR/LYMPHATIC: Normal course and caliber of the major abdominal
vessels. No abdominal or pelvic lymphadenopathy.

REPRODUCTIVE: Normal prostate size with symmetric seminal vesicles.

MUSCULOSKELETAL. No bony spinal canal stenosis or focal osseous
abnormality.

OTHER: None.
IMPRESSION: Right-sided obstructive uropathy with 3-4 mm stone in the distal
right ureter causing mild hydroureteronephrosis and mild
periureteral stranding.

## 2022-07-09 ENCOUNTER — Other Ambulatory Visit: Payer: Self-pay | Admitting: Internal Medicine

## 2022-07-09 DIAGNOSIS — K219 Gastro-esophageal reflux disease without esophagitis: Secondary | ICD-10-CM

## 2022-07-09 NOTE — Telephone Encounter (Signed)
Please refill as per office routine med refill policy (all routine meds to be refilled for 3 mo or monthly (per pt preference) up to one year from last visit, then month to month grace period for 3 mo, then further med refills will have to be denied) ? ?

## 2022-08-28 ENCOUNTER — Encounter: Payer: Self-pay | Admitting: Internal Medicine

## 2022-08-28 ENCOUNTER — Ambulatory Visit (INDEPENDENT_AMBULATORY_CARE_PROVIDER_SITE_OTHER): Payer: Self-pay | Admitting: Internal Medicine

## 2022-08-28 VITALS — BP 138/82 | HR 89 | Temp 98.3°F | Ht 70.0 in | Wt 248.0 lb

## 2022-08-28 DIAGNOSIS — G43909 Migraine, unspecified, not intractable, without status migrainosus: Secondary | ICD-10-CM | POA: Insufficient documentation

## 2022-08-28 DIAGNOSIS — F5101 Primary insomnia: Secondary | ICD-10-CM

## 2022-08-28 DIAGNOSIS — F419 Anxiety disorder, unspecified: Secondary | ICD-10-CM

## 2022-08-28 DIAGNOSIS — G43809 Other migraine, not intractable, without status migrainosus: Secondary | ICD-10-CM

## 2022-08-28 DIAGNOSIS — K219 Gastro-esophageal reflux disease without esophagitis: Secondary | ICD-10-CM

## 2022-08-28 MED ORDER — PANTOPRAZOLE SODIUM 40 MG PO TBEC: 40.0000 mg | DELAYED_RELEASE_TABLET | Freq: Every day | ORAL | 3 refills | Status: DC

## 2022-08-28 MED ORDER — CITALOPRAM HYDROBROMIDE 20 MG PO TABS: 20.0000 mg | ORAL_TABLET | Freq: Every day | ORAL | 3 refills | Status: DC

## 2022-08-28 MED ORDER — ZOLPIDEM TARTRATE 10 MG PO TABS: ORAL_TABLET | ORAL | 1 refills | Status: DC

## 2022-08-28 MED ORDER — ALPRAZOLAM 0.5 MG PO TABS: 0.5000 mg | ORAL_TABLET | Freq: Two times a day (BID) | ORAL | 1 refills | Status: DC | PRN

## 2022-08-28 NOTE — Assessment & Plan Note (Signed)
Chronic persistent but responds to [place]  - for refil

## 2022-08-28 NOTE — Assessment & Plan Note (Signed)
Much worsening recently, likely element of PTSD as well - declines counseling referral, ok for celexa 20 mg qd, and xanax 0.5 mg bid prn

## 2022-08-28 NOTE — Assessment & Plan Note (Signed)
Ok to continue advil prn

## 2022-08-28 NOTE — Patient Instructions (Signed)
Please take all new medication as prescribed  - the celexa 20 mg per day, and xanax as needed only  Please continue all other medications as before, and refills have been done if requested - the protonix and ambien  Please have the pharmacy call with any other refills you may need.  Please continue your efforts at being more active, low cholesterol diet, and weight control.  Please keep your appointments with your specialists as you may have planned  Please make an Appointment to return in 6 months, or sooner if needed
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
## 2022-08-28 NOTE — Assessment & Plan Note (Signed)
Uncontrolled, ok to restart the protonix 40 mg qd

## 2022-08-31 ENCOUNTER — Encounter: Payer: Self-pay | Admitting: Internal Medicine

## 2022-08-31 DIAGNOSIS — E559 Vitamin D deficiency, unspecified: Secondary | ICD-10-CM

## 2022-08-31 DIAGNOSIS — Z125 Encounter for screening for malignant neoplasm of prostate: Secondary | ICD-10-CM

## 2022-08-31 DIAGNOSIS — E538 Deficiency of other specified B group vitamins: Secondary | ICD-10-CM

## 2022-08-31 DIAGNOSIS — E7800 Pure hypercholesterolemia, unspecified: Secondary | ICD-10-CM

## 2022-08-31 DIAGNOSIS — R739 Hyperglycemia, unspecified: Secondary | ICD-10-CM

## 2022-09-03 NOTE — Telephone Encounter (Signed)
LOV 08/28/22, pt was seen for anxiety. He is requesting to have labs drawn at the [facility] location, please advise

## 2022-09-11 ENCOUNTER — Encounter: Payer: Self-pay | Admitting: Internal Medicine

## 2022-09-11 ENCOUNTER — Telehealth (INDEPENDENT_AMBULATORY_CARE_PROVIDER_SITE_OTHER): Payer: Self-pay | Admitting: Internal Medicine

## 2022-09-11 DIAGNOSIS — K219 Gastro-esophageal reflux disease without esophagitis: Secondary | ICD-10-CM

## 2022-09-11 DIAGNOSIS — F419 Anxiety disorder, unspecified: Secondary | ICD-10-CM

## 2022-09-11 DIAGNOSIS — G43809 Other migraine, not intractable, without status migrainosus: Secondary | ICD-10-CM

## 2022-09-11 DIAGNOSIS — F5101 Primary insomnia: Secondary | ICD-10-CM

## 2022-09-11 MED ORDER — ALPRAZOLAM 0.25 MG PO TABS: 0.2500 mg | ORAL_TABLET | Freq: Two times a day (BID) | ORAL | 1 refills | Status: DC | PRN

## 2022-09-11 NOTE — Patient Instructions (Signed)
[place] for note today  [place] for celexa at 10 mg daily, also xanax .25 mg as discussed  Please continue all other medications as before, and refills have been done if requested.  Please have the pharmacy call with any other refills you may need.  Please keep your appointments with your specialists as you may have planned

## 2022-09-11 NOTE — Assessment & Plan Note (Signed)
[place] for note for tinted windows to help prevention migraine

## 2022-09-11 NOTE — Progress Notes (Signed)
Patient ID: Eric Gonzales, male   DOB: [birth year], 42 y.o.   MRN: [id number]  Virtual Visit via Video Note  I connected with Elouise Munroe on 09/11/22 at 10:20 AM EST by a video enabled telemedicine application and verified that I am speaking with the correct person using two identifiers.  Location of all participants today Patient: at home Provider: at office   I discussed the limitations of evaluation and management by telemedicine and the availability of in person appointments. The patient expressed understanding and agreed to proceed.  History of Present Illness: Overall states much improved anxiety but getting along with half dosing of celexa and xanax,  Denies worsening depressive symptoms, suicidal ideation, or panic; has ongoing anxiety. Needs note to alllow tinted windows in the car as bright daytime light brings on migraines.  Has ongoing insomina but ambnein working well.  Denies worsening reflux, abd pain, dysphagia, n/v, bowel change or blood.  Pt denies chest pain, increased sob or doe, wheezing, orthopnea, PND, increased LE swelling, palpitations, dizziness or syncope.   Pt denies polydipsia, polyuria, or new focal neuro s/s.     Past Medical History:  Diagnosis Date   Allergic rhinitis    GERD (gastroesophageal reflux disease) 01/03/2018   History reviewed. No pertinent surgical history.  reports that he has never smoked. He has never used smokeless tobacco. He reports that he does not drink alcohol and does not use drugs. family history includes Alcohol abuse in his maternal grandfather; Diabetes in his father and mother; Heart disease in his father and mother; Hyperlipidemia in his father and mother. No Known Allergies Current Outpatient Medications on File Prior to Visit  Medication Sig Dispense Refill   citalopram (CELEXA) 20 MG tablet Take 1 tablet (20 mg total) by mouth daily. 90 tablet 3   pantoprazole (PROTONIX) 40 MG tablet Take 1 tablet (40 mg total) by mouth daily.  90 tablet 3   zolpidem (AMBIEN) 10 MG tablet TAKE ONE TABLET BY MOUTH ONE TIME DAILY AT BEDTIME AS NEEDED FOR SLEEP 90 tablet 1   No current facility-administered medications on file prior to visit.   Observations/Objective: Alert, NAD, appropriate mood and affect, resps normal, cn 2-12 intact, moves all 4s, no visible rash or swelling No results found for: "WBC", "HGB", "HCT", "PLT", "GLUCOSE", "CHOL", "TRIG", "HDL", "LDLDIRECT", "LDLCALC", "ALT", "AST", "NA", "K", "CL", "CREATININE", "BUN", "CO2", "TSH", "PSA", "INR", "GLUF", "HGBA1C", "MICROALBUR"  Assessment and Plan: See notes  Follow Up Instructions: See notes   I discussed the assessment and treatment plan with the patient. The patient was provided an opportunity to ask questions and all were answered. The patient agreed with the plan and demonstrated an understanding of the instructions.   The patient was advised to call back or seek an in-person evaluation if the symptoms worsen or if the condition fails to improve as anticipated.   Cathlean Cower, MD

## 2022-09-11 NOTE — Assessment & Plan Note (Signed)
Controlled, ok to cont PPI

## 2022-09-11 NOTE — Assessment & Plan Note (Signed)
Improved, to continue ambien qhs prn

## 2022-09-11 NOTE — Assessment & Plan Note (Signed)
Improed, ok to continue celexa 10 mg and xanax .25 bid prn

## 2023-02-20 ENCOUNTER — Encounter: Payer: Self-pay | Admitting: Internal Medicine

## 2023-02-21 MED ORDER — ZOLPIDEM TARTRATE 10 MG PO TABS: ORAL_TABLET | ORAL | 1 refills | Status: DC

## 2023-02-21 MED ORDER — ALPRAZOLAM 0.25 MG PO TABS: 0.2500 mg | ORAL_TABLET | Freq: Two times a day (BID) | ORAL | 1 refills | Status: DC | PRN

## 2023-03-12 ENCOUNTER — Encounter: Payer: Self-pay | Admitting: Internal Medicine

## 2023-03-12 MED ORDER — PREDNISONE 10 MG PO TABS: ORAL_TABLET | ORAL | 0 refills | Status: DC

## 2023-06-19 ENCOUNTER — Ambulatory Visit: Payer: Self-pay | Admitting: Internal Medicine

## 2023-06-26 ENCOUNTER — Ambulatory Visit (INDEPENDENT_AMBULATORY_CARE_PROVIDER_SITE_OTHER): Payer: Self-pay | Admitting: Internal Medicine

## 2023-06-26 ENCOUNTER — Ambulatory Visit: Payer: Self-pay | Admitting: Internal Medicine

## 2023-06-26 VITALS — BP 160/92 | HR 92 | Temp 98.1°F | Ht 70.0 in | Wt 252.0 lb

## 2023-06-26 DIAGNOSIS — F419 Anxiety disorder, unspecified: Secondary | ICD-10-CM

## 2023-06-26 DIAGNOSIS — J309 Allergic rhinitis, unspecified: Secondary | ICD-10-CM

## 2023-06-26 DIAGNOSIS — R030 Elevated blood-pressure reading, without diagnosis of hypertension: Secondary | ICD-10-CM

## 2023-06-26 DIAGNOSIS — L259 Unspecified contact dermatitis, unspecified cause: Secondary | ICD-10-CM

## 2023-06-26 DIAGNOSIS — K219 Gastro-esophageal reflux disease without esophagitis: Secondary | ICD-10-CM

## 2023-06-26 MED ORDER — ALPRAZOLAM 0.25 MG PO TABS: 0.2500 mg | ORAL_TABLET | Freq: Three times a day (TID) | ORAL | 1 refills | Status: DC | PRN

## 2023-06-26 MED ORDER — TEMOVATE 0.05 % EX CREA: 1.0000 | TOPICAL_CREAM | Freq: Two times a day (BID) | CUTANEOUS | 1 refills | Status: DC | PRN

## 2023-06-26 NOTE — Patient Instructions (Signed)
Please take all new medication as prescribed  - the cream  Ok to take up to three of the xanax per day as needed  Please continue all other medications as before, and refills have been done if requested.  Please have the pharmacy call with any other refills you may need.  Please continue your efforts at being more active, low cholesterol diet, and weight control.  Please keep your appointments with your specialists as you may have planned  Please make an Appointment to return for your 1 year visit, or sooner if needed

## 2023-06-26 NOTE — Progress Notes (Signed)
Patient ID: Eric Gonzales, male   DOB: 25-Mar-1981, 42 y.o.   MRN: [id number]        Chief Complaint: follow up contact dermatitis, elev BP without htn, anxiety       HPI:  Eric Gonzales is a 42 y.o. male here with c/o rash to isolated areas mutiple all over after working in the yard apparently with topical allergens, such as neck arms, left side and legs.  Pt denies chest pain, increased sob or doe, wheezing, orthopnea, PND, increased LE swelling, palpitations, dizziness or syncope.   Pt denies polydipsia, polyuria, or new focal neuro s/s.   Denies worsening depressive symptoms, suicidal ideation, or panic; has ongoing anxiety mild worsening in the past 3 mo.  Denies worsening reflux, abd pain, dysphagia, n/v, bowel change or blood. Denies worsening reflux, abd pain, dysphagia, n/v, bowel change or blood.       Wt Readings from Last 3 Encounters:  06/26/23 252 lb (114.3 kg)  08/28/22 248 lb (112.5 kg)  10/06/21 250 lb (113.4 kg)   BP Readings from Last 3 Encounters:  06/26/23 (!) 160/92  08/28/22 138/82  10/06/21 116/68         Past Medical History:  Diagnosis Date   Allergic rhinitis    GERD (gastroesophageal reflux disease) 01/03/2018   History reviewed. No pertinent surgical history.  reports that he has never smoked. He has never used smokeless tobacco. He reports that he does not drink alcohol and does not use drugs. family history includes Alcohol abuse in his maternal grandfather; Diabetes in his father and mother; Heart disease in his father and mother; Hyperlipidemia in his father and mother. No Known Allergies Current Outpatient Medications on File Prior to Visit  Medication Sig Dispense Refill   citalopram (CELEXA) 20 MG tablet Take 1 tablet (20 mg total) by mouth daily. 90 tablet 3   pantoprazole (PROTONIX) 40 MG tablet Take 1 tablet (40 mg total) by mouth daily. 90 tablet 3   zolpidem (AMBIEN) 10 MG tablet TAKE ONE TABLET BY MOUTH ONE TIME DAILY AT BEDTIME AS NEEDED FOR  SLEEP 90 tablet 1   No current facility-administered medications on file prior to visit.        ROS:  All others reviewed and negative.  Objective        PE:  BP (!) 160/92 (BP Location: Left Arm, Patient Position: Sitting, Cuff Size: Normal)   Pulse 92   Temp 98.1 F (36.7 C) (Oral)   Ht 5\' 10"  (1.778 m)   Wt 252 lb (114.3 kg)   SpO2 99%   BMI 36.16 kg/m                 Constitutional: Pt appears in NAD               HENT: Head: NCAT.                Right Ear: External ear normal.                 Left Ear: External ear normal.                Eyes: . Pupils are equal, round, and reactive to light. Conjunctivae and EOM are normal               Nose: without d/c or deformity               Neck: Neck supple. Gross normal ROM  Cardiovascular: Normal rate and regular rhythm.                 Pulmonary/Chest: Effort normal and breath sounds without rales or wheezing.                Abd:  Soft, NT, ND, + BS, no organomegaly               Neurological: Pt is alert. At baseline orientation, motor grossly intact               Skin: Skin is warm. Contact derm like rash to neck, torso, extremities and left abdomen, no other new lesions, LE edema - none               Psychiatric: Pt behavior is normal without agitation , moderate nervous  Micro: none  Cardiac tracings I have personally interpreted today:  none  Pertinent Radiological findings (summarize): none   No results found for: "WBC", "HGB", "HCT", "PLT", "GLUCOSE", "CHOL", "TRIG", "HDL", "LDLDIRECT", "LDLCALC", "ALT", "AST", "NA", "K", "CL", "CREATININE", "BUN", "CO2", "TSH", "PSA", "INR", "GLUF", "HGBA1C", "MICROALBUR" Assessment/Plan:  Eric Gonzales is a 42 y.o. White or Caucasian [1] male with  has a past medical history of Allergic rhinitis and GERD (gastroesophageal reflux disease) (01/03/2018).  Contact dermatitis Mild to mod, for Temovate cr DAW as per pt request as this has worked before,  to f/u any worsening  symptoms or concerns  Blood pressure elevated without history of HTN Mild, likely situational,  to f/u any worsening symptoms or concerns   Anxiety Mild to mod, for xanax refill asd, cont celexa 20 every day, to f/u any worsening symptoms or concerns, declines referral counseling for now  GERD (gastroesophageal reflux disease) Stable, cont PPI  Allergic rhinitis Mild to mod, for otc allegra and/or nasacort asd,,  to f/u any worsening symptoms or concerns  Followup: Return in about 1 year (around 06/25/2024).  Oliver Barre, MD 06/29/2023 1:46 PM [facility] [place] [facility]

## 2023-06-29 ENCOUNTER — Encounter: Payer: Self-pay | Admitting: Internal Medicine

## 2023-06-29 DIAGNOSIS — L259 Unspecified contact dermatitis, unspecified cause: Secondary | ICD-10-CM | POA: Insufficient documentation

## 2023-06-29 DIAGNOSIS — R030 Elevated blood-pressure reading, without diagnosis of hypertension: Secondary | ICD-10-CM | POA: Insufficient documentation

## 2023-06-29 NOTE — Assessment & Plan Note (Signed)
Mild to mod, for Temovate cr DAW as per pt request as this has worked before,  to f/u any worsening symptoms or concerns

## 2023-06-29 NOTE — Assessment & Plan Note (Signed)
Mild, likely situational,  to f/u any worsening symptoms or concerns

## 2023-06-29 NOTE — Assessment & Plan Note (Signed)
Mild to mod, for otc allegra and/or nasacort asd,,  to f/u any worsening symptoms or concerns

## 2023-06-29 NOTE — Assessment & Plan Note (Signed)
Stable, cont PPI

## 2023-06-29 NOTE — Assessment & Plan Note (Addendum)
Mild to mod, for xanax refill asd, cont celexa 20 every day, to f/u any worsening symptoms or concerns, declines referral counseling for now

## 2023-07-03 ENCOUNTER — Telehealth: Payer: Self-pay | Admitting: Internal Medicine

## 2023-07-03 MED ORDER — CLOBETASOL PROPIONATE 0.05 % EX CREA: 1.0000 | TOPICAL_CREAM | Freq: Two times a day (BID) | CUTANEOUS | 1 refills | Status: DC | PRN

## 2023-07-03 NOTE — Telephone Encounter (Signed)
Pharmacy states that the TEMOVATE 0.05 % medication is not available but they do have the generic version.   Please send new RX for the generic medication and please specify if it needs to be the cream or the ointment.

## 2023-07-03 NOTE — Telephone Encounter (Signed)
Done erx 

## 2023-07-28 ENCOUNTER — Other Ambulatory Visit: Payer: Self-pay | Admitting: Internal Medicine

## 2023-07-29 ENCOUNTER — Other Ambulatory Visit: Payer: Self-pay

## 2023-09-23 ENCOUNTER — Other Ambulatory Visit: Payer: Self-pay | Admitting: Internal Medicine

## 2023-09-26 ENCOUNTER — Other Ambulatory Visit: Payer: Self-pay | Admitting: Internal Medicine

## 2023-12-05 ENCOUNTER — Other Ambulatory Visit: Payer: Self-pay | Admitting: Internal Medicine

## 2023-12-05 ENCOUNTER — Other Ambulatory Visit: Payer: Self-pay

## 2023-12-05 DIAGNOSIS — K219 Gastro-esophageal reflux disease without esophagitis: Secondary | ICD-10-CM

## 2024-01-26 ENCOUNTER — Other Ambulatory Visit: Payer: Self-pay | Admitting: Internal Medicine

## 2024-02-06 ENCOUNTER — Encounter: Payer: Self-pay | Admitting: Internal Medicine

## 2024-02-07 MED ORDER — TRIAMCINOLONE ACETONIDE 0.5 % EX CREA: 1.0000 | TOPICAL_CREAM | Freq: Three times a day (TID) | CUTANEOUS | 1 refills | Status: AC

## 2024-03-26 ENCOUNTER — Other Ambulatory Visit: Payer: Self-pay | Admitting: Internal Medicine

## 2024-05-24 ENCOUNTER — Other Ambulatory Visit: Payer: Self-pay | Admitting: Internal Medicine

## 2024-07-21 ENCOUNTER — Encounter: Payer: Self-pay | Admitting: Internal Medicine

## 2024-08-01 ENCOUNTER — Other Ambulatory Visit: Payer: Self-pay | Admitting: Internal Medicine

## 2024-08-03 ENCOUNTER — Other Ambulatory Visit: Payer: Self-pay

## 2024-08-04 ENCOUNTER — Encounter: Payer: Self-pay | Admitting: Internal Medicine

## 2024-08-17 ENCOUNTER — Other Ambulatory Visit: Payer: Self-pay | Admitting: Internal Medicine

## 2024-08-25 ENCOUNTER — Ambulatory Visit: Payer: Self-pay | Admitting: Internal Medicine

## 2024-08-26 ENCOUNTER — Ambulatory Visit (INDEPENDENT_AMBULATORY_CARE_PROVIDER_SITE_OTHER): Payer: Self-pay | Admitting: Internal Medicine

## 2024-08-26 VITALS — BP 128/78 | HR 75 | Temp 98.1°F | Ht 70.0 in | Wt 243.0 lb

## 2024-08-26 DIAGNOSIS — K219 Gastro-esophageal reflux disease without esophagitis: Secondary | ICD-10-CM

## 2024-08-26 DIAGNOSIS — F5101 Primary insomnia: Secondary | ICD-10-CM

## 2024-08-26 DIAGNOSIS — F419 Anxiety disorder, unspecified: Secondary | ICD-10-CM

## 2024-08-26 DIAGNOSIS — R030 Elevated blood-pressure reading, without diagnosis of hypertension: Secondary | ICD-10-CM

## 2024-08-26 MED ORDER — PANTOPRAZOLE SODIUM 40 MG PO TBEC: 40.0000 mg | DELAYED_RELEASE_TABLET | Freq: Every day | ORAL | 3 refills | Status: AC

## 2024-08-26 MED ORDER — ZOLPIDEM TARTRATE 10 MG PO TABS: 10.0000 mg | ORAL_TABLET | Freq: Every evening | ORAL | 1 refills | Status: AC | PRN

## 2024-08-26 MED ORDER — ALPRAZOLAM 0.25 MG PO TABS: 0.2500 mg | ORAL_TABLET | Freq: Three times a day (TID) | ORAL | 2 refills | Status: AC | PRN

## 2024-08-26 MED ORDER — CITALOPRAM HYDROBROMIDE 20 MG PO TABS: 20.0000 mg | ORAL_TABLET | Freq: Every day | ORAL | 3 refills | Status: AC

## 2024-08-26 NOTE — Patient Instructions (Signed)

## 2024-08-26 NOTE — Assessment & Plan Note (Signed)
 Stable, cont ambien  at bedtime prn

## 2024-08-26 NOTE — Assessment & Plan Note (Signed)
 BP Readings from Last 3 Encounters:  08/26/24 128/78  06/26/23 (!) 160/92  08/28/22 138/82   Stable, pt to continue medical treatment - diet, wt control

## 2024-08-26 NOTE — Progress Notes (Signed)
 Patient ID: Eric Gonzales, male   DOB: 12/16/1980, 44 y.o.   MRN: [id number]        Chief Complaint: follow up borderline HTN, anxiety, gerd, insomnia       HPI:  Eric Gonzales is a 44 y.o. male here overall doing ok, Denies worsening depressive symptoms, suicidal ideation, or panic; has ongoing anxiety, needs med refills.  Pt denies chest pain, increased sob or doe, wheezing, orthopnea, PND, increased LE swelling, palpitations, dizziness or syncope.   Pt denies polydipsia, polyuria, or new focal neuro s/s.    Pt denies fever, wt loss, night sweats, loss of appetite, or other constitutional symptoms  BP has been ok at home.        Wt Readings from Last 3 Encounters:  08/26/24 243 lb (110.2 kg)  06/26/23 252 lb (114.3 kg)  08/28/22 248 lb (112.5 kg)   BP Readings from Last 3 Encounters:  08/26/24 128/78  06/26/23 (!) 160/92  08/28/22 138/82         Past Medical History:  Diagnosis Date   Allergic rhinitis    GERD (gastroesophageal reflux disease) 01/03/2018   History reviewed. No pertinent surgical history.  reports that he has never smoked. He has never used smokeless tobacco. He reports that he does not drink alcohol and does not use drugs. family history includes Alcohol abuse in his maternal grandfather; Diabetes in his father and mother; Heart disease in his father and mother; Hyperlipidemia in his father and mother. Allergies[1] Medications Ordered Prior to Encounter[2]      ROS:  All others reviewed and negative.  Objective        PE:  BP 128/78 (BP Location: Right Arm, Patient Position: Sitting, Cuff Size: Normal)   Pulse 75   Temp 98.1 F (36.7 C) (Oral)   Ht 5' 10 (1.778 m)   Wt 243 lb (110.2 kg)   SpO2 99%   BMI 34.87 kg/m                 Constitutional: Pt appears in NAD               HENT: Head: NCAT.                Right Ear: External ear normal.                 Left Ear: External ear normal.                Eyes: . Pupils are equal, round, and reactive to  light. Conjunctivae and EOM are normal               Nose: without d/c or deformity               Neck: Neck supple. Gross normal ROM               Cardiovascular: Normal rate and regular rhythm.                 Pulmonary/Chest: Effort normal and breath sounds without rales or wheezing.                Abd:  Soft, NT, ND, + BS, no organomegaly               Neurological: Pt is alert. At baseline orientation, motor grossly intact               Skin: Skin is warm. No rashes, no other new lesions,  LE edema - none               Psychiatric: Pt behavior is normal without agitation   Micro: none  Cardiac tracings I have personally interpreted today:  none  Pertinent Radiological findings (summarize): none   No results found for: WBC, HGB, HCT, PLT, GLUCOSE, CHOL, TRIG, HDL, LDLDIRECT, LDLCALC, ALT, AST, NA, K, CL, CREATININE, BUN, CO2, TSH, PSA, INR, GLUF, HGBA1C, MICROALBUR Assessment/Plan:  Eric Gonzales is a 44 y.o. White or Caucasian [1] male with  has a past medical history of Allergic rhinitis and GERD (gastroesophageal reflux disease) (01/03/2018).  Anxiety Overall stable, continue current celexa  20 every day, xanax  prn, declines need for change or counseling  Blood pressure elevated without history of HTN BP Readings from Last 3 Encounters:  08/26/24 128/78  06/26/23 (!) 160/92  08/28/22 138/82   Stable, pt to continue medical treatment - diet, wt control  GERD (gastroesophageal reflux disease) Stable, cont PPI  Insomnia Stable, cont ambien  at bedtime prn  Followup: Return in about 6 months (around 02/23/2025).  Lynwood Rush, MD 08/26/2024 12:20 PM [facility]     [1] No Known Allergies [2]  Current Outpatient Medications on File Prior to Visit  Medication Sig Dispense Refill   triamcinolone  cream (KENALOG ) 0.5 % Apply 1 Application topically 3 (three)  times daily. 30 g 1   No current facility-administered medications on file prior to visit.

## 2024-08-26 NOTE — Assessment & Plan Note (Signed)
 Stable, cont PPI

## 2024-08-26 NOTE — Assessment & Plan Note (Signed)
 Overall stable, continue current celexa  20 every day, xanax  prn, declines need for change or counseling
